# Patient Record
Sex: Male | Born: 1955 | Race: White | Hispanic: No | Marital: Married | Smoking: Never smoker
Health system: Southern US, Community
[De-identification: ages and names within clinical notes are randomized; demographics above are authoritative.]

## PROBLEM LIST (undated history)

## (undated) DIAGNOSIS — E7800 Pure hypercholesterolemia, unspecified: Secondary | ICD-10-CM

## (undated) DIAGNOSIS — N400 Enlarged prostate without lower urinary tract symptoms: Secondary | ICD-10-CM

---

## 2008-03-26 ENCOUNTER — Emergency Department (HOSPITAL_BASED_OUTPATIENT_CLINIC_OR_DEPARTMENT_OTHER): Admission: EM | Admit: 2008-03-26 | Discharge: 2008-03-26 | Payer: Self-pay | Admitting: Emergency Medicine

## 2012-06-13 ENCOUNTER — Encounter (HOSPITAL_BASED_OUTPATIENT_CLINIC_OR_DEPARTMENT_OTHER): Payer: Self-pay | Admitting: *Deleted

## 2012-06-13 ENCOUNTER — Emergency Department (HOSPITAL_BASED_OUTPATIENT_CLINIC_OR_DEPARTMENT_OTHER): Payer: BC Managed Care – PPO

## 2012-06-13 ENCOUNTER — Emergency Department (HOSPITAL_BASED_OUTPATIENT_CLINIC_OR_DEPARTMENT_OTHER)
Admission: EM | Admit: 2012-06-13 | Discharge: 2012-06-13 | Disposition: A | Discharge status: Home / Self Care | Payer: BC Managed Care – PPO | Attending: Emergency Medicine | Admitting: Emergency Medicine

## 2012-06-13 DIAGNOSIS — IMO0002 Reserved for concepts with insufficient information to code with codable children: Secondary | ICD-10-CM | POA: Insufficient documentation

## 2012-06-13 DIAGNOSIS — Y939 Activity, unspecified: Secondary | ICD-10-CM | POA: Insufficient documentation

## 2012-06-13 DIAGNOSIS — Z87448 Personal history of other diseases of urinary system: Secondary | ICD-10-CM | POA: Insufficient documentation

## 2012-06-13 DIAGNOSIS — Y9289 Other specified places as the place of occurrence of the external cause: Secondary | ICD-10-CM | POA: Insufficient documentation

## 2012-06-13 DIAGNOSIS — E7800 Pure hypercholesterolemia, unspecified: Secondary | ICD-10-CM | POA: Insufficient documentation

## 2012-06-13 DIAGNOSIS — M7020 Olecranon bursitis, unspecified elbow: Secondary | ICD-10-CM | POA: Insufficient documentation

## 2012-06-13 DIAGNOSIS — Z79899 Other long term (current) drug therapy: Secondary | ICD-10-CM | POA: Insufficient documentation

## 2012-06-13 DIAGNOSIS — M7021 Olecranon bursitis, right elbow: Secondary | ICD-10-CM

## 2012-06-13 HISTORY — DX: Pure hypercholesterolemia, unspecified: E78.00

## 2012-06-13 HISTORY — DX: Benign prostatic hyperplasia without lower urinary tract symptoms: N40.0

## 2012-06-13 MED ORDER — SULFAMETHOXAZOLE-TRIMETHOPRIM 800-160 MG PO TABS: 2.0000 | ORAL_TABLET | Freq: Two times a day (BID) | ORAL | Status: DC

## 2012-06-13 MED ORDER — HYDROCODONE-ACETAMINOPHEN 5-325 MG PO TABS: 2.0000 | ORAL_TABLET | ORAL | Status: DC | PRN

## 2012-06-13 NOTE — ED Provider Notes (Signed)
History  This chart was scribed for Gilda Crease, MD by Ardelia Mems, ED Scribe. This patient was seen in room MHH1/M[facility]1 and the patient's care was started at 5:45 PM.   CSN: [id number]  Arrival date & time 06/13/12  1657     Chief Complaint  Patient presents with  . Elbow Injury    The history is provided by the patient. No language interpreter was used.    HPI Comments: Jorge Arnold is a 57 y.o. male who presents to the Emergency Department complaining of an elbow injury that occurred 2 days ago at [facility]. Pt states that he hit his elbow on an amusement ride. Pt states that he has associated elbow pain, which was very mild at first and gradually has worsened over the past 2 days. Pt's elbow has associated redness and swelling, which he reports is gradually worsening. Pt has been taking 3 Aleve every 6 hours for pain with some relief. Pt states that he has a family dr. at [employer] and that he has never seen an orthopaedic dr. Pt denies head injury, neck pain,back pain, fever, nausea, diarrhea, vomiting or any other symptoms.   Past Medical History  Diagnosis Date  . Hypercholesteremia   . Prostate enlargement     History reviewed. No pertinent past surgical history.  History reviewed. No pertinent family history.  History  Substance Use Topics  . Smoking status: Never Smoker   . Smokeless tobacco: Not on file  . Alcohol Use: No      Review of Systems  Constitutional: Negative for fever and chills.  HENT: Negative for neck pain.   Gastrointestinal: Negative for nausea, vomiting and diarrhea.  Musculoskeletal: Negative for back pain.       Right elbow pain.    Allergies  Review of patient's allergies indicates no known allergies.  Home Medications   Current Outpatient Rx  Name  Route  Sig  Dispense  Refill  . simvastatin (ZOCOR) 5 MG tablet   Oral   Take 5 mg by mouth at bedtime.           BP 116/73  Pulse 62  Temp(Src) 98.2 F  (36.8 C) (Oral)  Resp 20  Ht 5\' 8"  (1.727 m)  Wt 179 lb (81.194 kg)  BMI 27.22 kg/m2  SpO2 97%  Physical Exam  Constitutional: He is oriented to person, place, and time. He appears well-developed and well-nourished. No distress.  HENT:  Head: Normocephalic and atraumatic.  Right Ear: Hearing normal.  Left Ear: Hearing normal.  Nose: Nose normal.  Mouth/Throat: Oropharynx is clear and moist and mucous membranes are normal.  Eyes: Conjunctivae and EOM are normal. Pupils are equal, round, and reactive to light.  Neck: Normal range of motion. Neck supple.  Cardiovascular: Regular rhythm, S1 normal and S2 normal.  Exam reveals no gallop and no friction rub.   No murmur heard. Pulmonary/Chest: Effort normal and breath sounds normal. No respiratory distress. He exhibits no tenderness.  Abdominal: Soft. Normal appearance and bowel sounds are normal. There is no hepatosplenomegaly. There is no tenderness. There is no rebound, no guarding, no tenderness at McBurney's point and negative Murphy's sign. No hernia.  Musculoskeletal: Normal range of motion.       Right elbow: He exhibits swelling. He exhibits normal range of motion. Tenderness found. Olecranon process tenderness noted.  Neurological: He is alert and oriented to person, place, and time. He has normal strength. No cranial nerve deficit or sensory deficit. Coordination normal.  GCS eye subscore is 4. GCS verbal subscore is 5. GCS motor subscore is 6.  Skin: Skin is warm, dry and intact. No rash noted. No cyanosis.     Psychiatric: He has a normal mood and affect. His speech is normal and behavior is normal. Thought content normal.    ED Course  Procedures (including critical care time)  DIAGNOSTIC STUDIES: Oxygen Saturation is 97% on RA, normal by my interpretation.    COORDINATION OF CARE: 5:47 PM- Pt advised of plan for treatment and pt agrees.     Labs Reviewed - No data to display Dg Elbow Complete Right  06/13/2012    *RADIOLOGY REPORT*  Clinical Data: Banged back of elbow on a roller coaster 2 days ago. Pain, swelling, redness.  RIGHT ELBOW - COMPLETE 3+ VIEW  Comparison: None.  Findings: There is no evidence for acute fracture or dislocation. No soft tissue foreign body or gas identified.  No evidence for joint effusion. There is soft tissue swelling along the posterior aspect of the elbow, adjacent to the olecranon.  IMPRESSION: 1.  Soft tissue swelling. 2. No evidence for acute osseous abnormality.   Original Report Authenticated By: Norva Pavlov, M.D.     Diagnosis: Olecranon Bursitis    MDM  PAtient presented with swollen, reddened area surrounding right elbow. Patient reports hitting the elbow 2 days prior. Xray negative. With erythema and warmth, must cover for infection.    I personally performed the services described in this documentation, which was scribed in my presence. The recorded information has been reviewed and is accurate.    Gilda Crease, MD 06/16/12 [phone]

## 2012-06-13 NOTE — ED Notes (Signed)
See MD assessment, nursing assessment not completed prior to discharge

## 2012-06-13 NOTE — ED Notes (Signed)
Pt states he injured his right elbow Friday on a ride at [facility]. Elbow is red and swollen.

## 2012-06-15 ENCOUNTER — Other Ambulatory Visit: Payer: Self-pay | Admitting: Family Medicine

## 2012-06-15 ENCOUNTER — Ambulatory Visit (INDEPENDENT_AMBULATORY_CARE_PROVIDER_SITE_OTHER): Payer: BC Managed Care – PPO | Admitting: Family Medicine

## 2012-06-15 ENCOUNTER — Encounter: Payer: Self-pay | Admitting: Family Medicine

## 2012-06-15 VITALS — BP 110/76 | HR 79 | Ht 68.0 in | Wt 179.0 lb

## 2012-06-15 DIAGNOSIS — M7020 Olecranon bursitis, unspecified elbow: Secondary | ICD-10-CM

## 2012-06-15 DIAGNOSIS — M7021 Olecranon bursitis, right elbow: Secondary | ICD-10-CM

## 2012-06-15 MED ORDER — DICLOXACILLIN SODIUM 500 MG PO CAPS: 500.0000 mg | ORAL_CAPSULE | Freq: Four times a day (QID) | ORAL | Status: DC

## 2012-06-15 MED ORDER — HYDROCODONE-ACETAMINOPHEN 5-325 MG PO TABS: 1.0000 | ORAL_TABLET | Freq: Four times a day (QID) | ORAL | Status: AC | PRN

## 2012-06-15 NOTE — Patient Instructions (Addendum)
Continue the bactrim until it is completed. Add dicloxacillin 4 times a day for 14 days. Finish these even if you're improving. If after 1-2 days you are still spiking fevers (above 100.4) or symptoms are worsening, return to the emergency department. In rare cases you may need IV antibiotics for this condition. Ice the area 3-4 times a day for 15 minutes at a time Aleve twice a day for pain, swelling, and inflammation. Norco as needed for severe pain. Compression wrap to help with swelling. Elbow pad for protection to prevent irritation and additional swelling. Follow up with me on Friday for reevaluation.

## 2012-06-16 ENCOUNTER — Observation Stay (HOSPITAL_BASED_OUTPATIENT_CLINIC_OR_DEPARTMENT_OTHER)
Admission: EM | Admit: 2012-06-16 | Discharge: 2012-06-17 | DRG: 248 | Disposition: A | Discharge status: Home / Self Care | Payer: BC Managed Care – PPO | Attending: Internal Medicine | Admitting: Internal Medicine

## 2012-06-16 ENCOUNTER — Encounter: Payer: Self-pay | Admitting: Family Medicine

## 2012-06-16 ENCOUNTER — Encounter (HOSPITAL_BASED_OUTPATIENT_CLINIC_OR_DEPARTMENT_OTHER): Payer: Self-pay | Admitting: *Deleted

## 2012-06-16 DIAGNOSIS — E785 Hyperlipidemia, unspecified: Secondary | ICD-10-CM | POA: Insufficient documentation

## 2012-06-16 DIAGNOSIS — M7021 Olecranon bursitis, right elbow: Secondary | ICD-10-CM

## 2012-06-16 DIAGNOSIS — M7020 Olecranon bursitis, unspecified elbow: Principal | ICD-10-CM | POA: Insufficient documentation

## 2012-06-16 DIAGNOSIS — M71121 Other infective bursitis, right elbow: Secondary | ICD-10-CM

## 2012-06-16 DIAGNOSIS — M71129 Other infective bursitis, unspecified elbow: Secondary | ICD-10-CM

## 2012-06-16 DIAGNOSIS — E7800 Pure hypercholesterolemia, unspecified: Secondary | ICD-10-CM | POA: Insufficient documentation

## 2012-06-16 LAB — CBC WITH DIFFERENTIAL/PLATELET
Basophils Absolute: 0 10*3/uL (ref 0.0–0.1)
Basophils Relative: 0 % (ref 0–1)
Eosinophils Absolute: 0.1 10*3/uL (ref 0.0–0.7)
Eosinophils Relative: 1 % (ref 0–5)
Lymphs Abs: 1.6 10*3/uL (ref 0.7–4.0)
MCH: 30.4 pg (ref 26.0–34.0)
Neutrophils Relative %: 70 % (ref 43–77)
Platelets: 169 10*3/uL (ref 150–400)
RBC: 4.71 MIL/uL (ref 4.22–5.81)
RDW: 12.1 % (ref 11.5–15.5)

## 2012-06-16 LAB — BASIC METABOLIC PANEL
Calcium: 9.6 mg/dL (ref 8.4–10.5)
GFR calc Af Amer: 69 mL/min — ABNORMAL LOW (ref >90)
GFR calc non Af Amer: 60 mL/min — ABNORMAL LOW (ref >90)
Glucose, Bld: 110 mg/dL — ABNORMAL HIGH (ref 70–99)
Potassium: 4 mEq/L (ref 3.5–5.1)
Sodium: 136 mEq/L (ref 135–145)

## 2012-06-16 LAB — BODY FLUID CELL COUNT WITH DIFFERENTIAL
Neutrophil Count, Fluid: 94 % — ABNORMAL HIGH (ref 0–25)
Total Nucleated Cell Count, Fluid: 2410 cu mm — ABNORMAL HIGH (ref 0–1000)

## 2012-06-16 MED ORDER — VANCOMYCIN HCL IN DEXTROSE 1-5 GM/200ML-% IV SOLN
1000.0000 mg | Freq: Once | INTRAVENOUS | Status: AC
  Administered 2012-06-16: 1000 mg via INTRAVENOUS
  Filled 2012-06-16: qty 200

## 2012-06-16 MED ORDER — SODIUM CHLORIDE 0.9 % IV SOLN
Freq: Once | INTRAVENOUS | Status: AC
  Administered 2012-06-16: 17:00:00 via INTRAVENOUS

## 2012-06-16 MED ORDER — HYDROCODONE-ACETAMINOPHEN 5-325 MG PO TABS
1.0000 | ORAL_TABLET | Freq: Once | ORAL | Status: AC
  Administered 2012-06-16: 1 via ORAL
  Filled 2012-06-16: qty 1

## 2012-06-16 MED ORDER — SODIUM CHLORIDE 0.9 % IV SOLN
INTRAVENOUS | Status: AC
  Administered 2012-06-16: 21:00:00 via INTRAVENOUS

## 2012-06-16 NOTE — Assessment & Plan Note (Signed)
concerning for septic bursitis.  Has full motion of elbow without pain.  Swelling is superficial within bursa and medial to this.  Aspirated this - 3 mL of cloudy yellow fluid obtained and sent for gram stain, culture, cell count.  Continue bactrim.  Add dicloxacillin.  Icing, compression.  F/u in 2-3 days.  If still spiking fever > 100.4 after 24 hours, worsening fatigue, dizziness advised to seek care in emergency department.  See instructions for further.  After informed written consent patient was lying supine on exam table.  Area overlying olecranon bursa prepped with alcohol swab, 3mL marcaine used for local anesthesia and bursa aspirated - 3 mL of cloudy yellow blood tinged fluid obtained and sent for analysis.

## 2012-06-16 NOTE — ED Notes (Signed)
Pt seen here Sunday for same, right elbow cellulitis, f/u up with sport med yesterday abx added , pt reports " feels worst today"

## 2012-06-16 NOTE — Progress Notes (Signed)
Patient ID: Jorge Arnold, male   DOB: Jan 02, 1956, 57 y.o.   MRN: [id number]  PCP: Gildardo Griffes, MD  Subjective:   HPI: Patient is a 57 y.o. male here for right elbow pain.  Patient reports on 6/6 while at [facility] he was on a rollercoaster and hit his right elbow on something to his side at his seat. Had a little pain but nothing severe.  Also had a very small cut on elbow. Then the next afternoon noticed worsening swelling, pain to the touch. This progressed to include redness, feeling feverish and fatigued the past 24 hours. Given norco and bactrim from ED which he is taking. No prior issues. Is right handed.  Past Medical History  Diagnosis Date  . Hypercholesteremia   . Prostate enlargement     Current Outpatient Prescriptions on File Prior to Visit  Medication Sig Dispense Refill  . simvastatin (ZOCOR) 5 MG tablet Take 5 mg by mouth at bedtime.      . sulfamethoxazole-trimethoprim (SEPTRA DS) 800-160 MG per tablet Take 2 tablets by mouth every 12 (twelve) hours.  40 tablet  0   No current facility-administered medications on file prior to visit.    History reviewed. No pertinent past surgical history.  No Known Allergies  History   Social History  . Marital Status: Married    Spouse Name: N/A    Number of Children: N/A  . Years of Education: N/A   Occupational History  . Not on file.   Social History Main Topics  . Smoking status: Never Smoker   . Smokeless tobacco: Not on file  . Alcohol Use: No  . Drug Use: No  . Sexually Active: Not on file   Other Topics Concern  . Not on file   Social History Narrative  . No narrative on file    Family History  Problem Relation Age of Onset  . Hypertension Mother   . Heart attack Father   . Diabetes Neg Hx   . Hyperlipidemia Neg Hx   . Sudden death Neg Hx     BP 110/76  Pulse 79  Ht 5\' 8"  (1.727 m)  Wt 179 lb (81.194 kg)  BMI 27.22 kg/m2  Review of Systems: See HPI above.    Objective:   Physical Exam:  Gen: NAD Temp 97.36F  R elbow: Boggy swelling over olecranon and medial to this.  Small cut with a scab over it over olecranon also.  No bruising.  Mild erythema and warmth around this as well. TTP over swollen area, olecranon bursa. FROM. Collateral ligaments intact. NVI distally.    Assessment & Plan:  1. Right olecranon bursitis - concerning for septic bursitis.  Has full motion of elbow without pain.  Swelling is superficial within bursa and medial to this.  Aspirated this - 3 mL of cloudy yellow fluid obtained and sent for gram stain, culture, cell count.  Continue bactrim.  Add dicloxacillin.  Icing, compression.  F/u in 2-3 days.  If still spiking fever > 100.4 after 24 hours, worsening fatigue, dizziness advised to seek care in emergency department.  See instructions for further.  After informed written consent patient was lying supine on exam table.  Area overlying olecranon bursa prepped with alcohol swab, 3mL marcaine used for local anesthesia and bursa aspirated - 3 mL of cloudy yellow blood tinged fluid obtained and sent for analysis.

## 2012-06-16 NOTE — ED Provider Notes (Signed)
Medical screening examination/treatment/procedure(s) were performed by non-physician practitioner and as supervising physician I was immediately available for consultation/collaboration.   Gwyneth Sprout, MD 06/16/12 2324

## 2012-06-16 NOTE — ED Notes (Signed)
Report to [facility] via phone-[facility] receiving nurse unavailable for report at this time-phone # given for call back

## 2012-06-16 NOTE — ED Notes (Signed)
PA at bedside.

## 2012-06-16 NOTE — ED Provider Notes (Signed)
History     CSN: [id number]  Arrival date & time 06/16/12  1448   First MD Initiated Contact with Patient 06/16/12 1604      Chief Complaint  Patient presents with  . Wound Check    (Consider location/radiation/quality/duration/timing/severity/associated sxs/prior treatment) Patient is a 57 y.o. male presenting with wound check. The history is provided by the patient. No language interpreter was used.  Wound Check The current episode started yesterday. Associated symptoms include chills, fatigue and myalgias. Pertinent negatives include no abdominal pain, chest pain, fever, headaches, nausea or vomiting. Associated symptoms comments: He returns to ED for recheck following drainage of an olecranon bursitis by Dr. Pearletha Forge yesterday. He feels a subjective fever but no temperature when measured at home. No N, V. Pain is slightly improved. Redness around the elbow is worse, but he states that he continues to feel poorly - fatigued, generally achy..    Past Medical History  Diagnosis Date  . Hypercholesteremia   . Prostate enlargement     History reviewed. No pertinent past surgical history.  Family History  Problem Relation Age of Onset  . Hypertension Mother   . Heart attack Father   . Diabetes Neg Hx   . Hyperlipidemia Neg Hx   . Sudden death Neg Hx     History  Substance Use Topics  . Smoking status: Never Smoker   . Smokeless tobacco: Not on file  . Alcohol Use: No      Review of Systems  Constitutional: Positive for chills and fatigue. Negative for fever.  Respiratory: Negative for shortness of breath.   Cardiovascular: Negative for chest pain.  Gastrointestinal: Negative for nausea, vomiting and abdominal pain.  Genitourinary: Negative for dysuria.  Musculoskeletal: Positive for myalgias.  Skin: Positive for color change.  Neurological: Negative for headaches.  Hematological: Does not bruise/bleed easily.  Psychiatric/Behavioral: Negative for confusion.     Allergies  Review of patient's allergies indicates no known allergies.  Home Medications   Current Outpatient Rx  Name  Route  Sig  Dispense  Refill  . dicloxacillin (DYNAPEN) 500 MG capsule   Oral   Take 1 capsule (500 mg total) by mouth 4 (four) times daily.   56 capsule   0   . HYDROcodone-acetaminophen (NORCO/VICODIN) 5-325 MG per tablet   Oral   Take 1 tablet by mouth every 6 (six) hours as needed for pain.   40 tablet   0   . simvastatin (ZOCOR) 5 MG tablet   Oral   Take 5 mg by mouth at bedtime.         . sulfamethoxazole-trimethoprim (SEPTRA DS) 800-160 MG per tablet   Oral   Take 2 tablets by mouth every 12 (twelve) hours.   40 tablet   0     BP 133/84  Pulse 66  Temp(Src) 98.5 F (36.9 C) (Oral)  Resp 18  SpO2 97%  Physical Exam  Constitutional: He is oriented to person, place, and time. He appears well-developed and well-nourished.  Neck: Normal range of motion.  Cardiovascular: Normal rate.   No murmur heard. Pulmonary/Chest: Effort normal. He has no wheezes. He has no rales.  Abdominal: There is no tenderness.  Musculoskeletal: Normal range of motion.  Right elbow moderately swollen with redness extending to volar aspect and distally to mid-distal forearm. No active drainage. Guarding elbow, mild tenderness.   Neurological: He is alert and oriented to person, place, and time.  Skin: Skin is warm and dry.  Psychiatric: He has  a normal mood and affect.    ED Course  Procedures (including critical care time)  Labs Reviewed - No data to display No results found. Results for orders placed during the hospital encounter of 06/16/12  CBC WITH DIFFERENTIAL      Result Value Range   WBC 8.2  4.0 - 10.5 K/uL   RBC 4.71  4.22 - 5.81 MIL/uL   Hemoglobin 14.3  13.0 - 17.0 g/dL   HCT 16.1  09.6 - 04.5 %   MCV 86.0  78.0 - 100.0 fL   MCH 30.4  26.0 - 34.0 pg   MCHC 35.3  30.0 - 36.0 g/dL   RDW 40.9  81.1 - 91.4 %   Platelets 169  150 - 400 K/uL    Neutrophils Relative % 70  43 - 77 %   Neutro Abs 5.8  1.7 - 7.7 K/uL   Lymphocytes Relative 19  12 - 46 %   Lymphs Abs 1.6  0.7 - 4.0 K/uL   Monocytes Relative 10  3 - 12 %   Monocytes Absolute 0.8  0.1 - 1.0 K/uL   Eosinophils Relative 1  0 - 5 %   Eosinophils Absolute 0.1  0.0 - 0.7 K/uL   Basophils Relative 0  0 - 1 %   Basophils Absolute 0.0  0.0 - 0.1 K/uL  BASIC METABOLIC PANEL      Result Value Range   Sodium 136  135 - 145 mEq/L   Potassium 4.0  3.5 - 5.1 mEq/L   Chloride 98  96 - 112 mEq/L   CO2 27  19 - 32 mEq/L   Glucose, Bld 110 (*) 70 - 99 mg/dL   BUN 17  6 - 23 mg/dL   Creatinine, Ser 7.82  0.50 - 1.35 mg/dL   Calcium 9.6  8.4 - 95.6 mg/dL   GFR calc non Af Amer 60 (*) >90 mL/min   GFR calc Af Amer 69 (*) >90 mL/min     No diagnosis found. 1. Septic bursitis    MDM  Discussed with Dr. Pearletha Forge who recommends admission for overnight observation and additional IV antibiotics (Vanc). He will follow in the office. Discussed with Dr. Toniann Fail who accepts patient for admission.         Arnoldo Hooker, PA-C 06/16/12 2026

## 2012-06-17 ENCOUNTER — Encounter (HOSPITAL_COMMUNITY): Payer: Self-pay | Admitting: Internal Medicine

## 2012-06-17 DIAGNOSIS — M71129 Other infective bursitis, unspecified elbow: Secondary | ICD-10-CM

## 2012-06-17 DIAGNOSIS — E785 Hyperlipidemia, unspecified: Secondary | ICD-10-CM

## 2012-06-17 DIAGNOSIS — A499 Bacterial infection, unspecified: Secondary | ICD-10-CM

## 2012-06-17 DIAGNOSIS — M7020 Olecranon bursitis, unspecified elbow: Secondary | ICD-10-CM

## 2012-06-17 MED ORDER — HYDROCODONE-ACETAMINOPHEN 5-325 MG PO TABS: 1.0000 | ORAL_TABLET | Freq: Four times a day (QID) | ORAL | Status: DC | PRN

## 2012-06-17 MED ORDER — VANCOMYCIN HCL IN DEXTROSE 1-5 GM/200ML-% IV SOLN
1000.0000 mg | Freq: Two times a day (BID) | INTRAVENOUS | Status: DC
  Administered 2012-06-17: 1000 mg via INTRAVENOUS
  Filled 2012-06-17 (×2): qty 200

## 2012-06-17 MED ORDER — DOCUSATE SODIUM 100 MG PO CAPS
100.0000 mg | ORAL_CAPSULE | Freq: Two times a day (BID) | ORAL | Status: DC
  Administered 2012-06-17 (×2): 100 mg via ORAL
  Filled 2012-06-17 (×2): qty 1

## 2012-06-17 MED ORDER — SIMVASTATIN 5 MG PO TABS
5.0000 mg | ORAL_TABLET | Freq: Every day | ORAL | Status: DC
  Filled 2012-06-17: qty 1

## 2012-06-17 MED ORDER — ONDANSETRON HCL 4 MG/2ML IJ SOLN: 4.0000 mg | Freq: Four times a day (QID) | INTRAMUSCULAR | Status: DC | PRN

## 2012-06-17 MED ORDER — SODIUM CHLORIDE 0.9 % IJ SOLN: 3.0000 mL | Freq: Two times a day (BID) | INTRAMUSCULAR | Status: DC

## 2012-06-17 MED ORDER — ONDANSETRON HCL 4 MG PO TABS: 4.0000 mg | ORAL_TABLET | Freq: Four times a day (QID) | ORAL | Status: DC | PRN

## 2012-06-17 MED ORDER — ZOLPIDEM TARTRATE 5 MG PO TABS: 5.0000 mg | ORAL_TABLET | Freq: Every evening | ORAL | Status: DC | PRN

## 2012-06-17 MED ORDER — SODIUM CHLORIDE 0.9 % IV SOLN: 250.0000 mL | INTRAVENOUS | Status: DC | PRN

## 2012-06-17 MED ORDER — HYDROMORPHONE HCL PF 1 MG/ML IJ SOLN: 1.0000 mg | INTRAMUSCULAR | Status: DC | PRN

## 2012-06-17 MED ORDER — SODIUM CHLORIDE 0.9 % IJ SOLN: 3.0000 mL | INTRAMUSCULAR | Status: DC | PRN

## 2012-06-17 NOTE — Progress Notes (Signed)
ANTIBIOTIC CONSULT NOTE - INITIAL  Pharmacy Consult for vancomycin Indication: septic bursitis  No Known Allergies  Patient Measurements: Height: 5\' 8"  (172.7 cm) Weight: 179 lb (81.194 kg) IBW/kg (Calculated) : 68.4  Vital Signs: Temp: 98.3 F (36.8 C) (06/11 2221) Temp src: Axillary (06/11 2022) BP: 134/79 mmHg (06/11 2221) Pulse Rate: 66 (06/11 2221)  Labs:  Recent Labs  06/16/12 1830  WBC 8.2  HGB 14.3  PLT 169  CREATININE 1.30   Estimated Creatinine Clearance: 61.4 ml/min (by C-G formula based on Cr of 1.3).   Microbiology: Recent Results (from the past 720 hour(s))  BODY FLUID CULTURE     Status: None   Collection Time    06/15/12 12:00 AM      Result Value Range Status   GRAM STAIN Few   Preliminary   GRAM STAIN WBC present-both PMN and Mononuclear   Preliminary   GRAM STAIN No Organisms Seen   Preliminary    Medical History: Past Medical History  Diagnosis Date  . Hypercholesteremia   . Prostate enlargement     Medications:  Prescriptions prior to admission  Medication Sig Dispense Refill  . dicloxacillin (DYNAPEN) 500 MG capsule Take 1 capsule (500 mg total) by mouth 4 (four) times daily.  56 capsule  0  . HYDROcodone-acetaminophen (NORCO/VICODIN) 5-325 MG per tablet Take 1 tablet by mouth every 6 (six) hours as needed for pain.  40 tablet  0  . simvastatin (ZOCOR) 5 MG tablet Take 5 mg by mouth at bedtime.      . sulfamethoxazole-trimethoprim (SEPTRA DS) 800-160 MG per tablet Take 2 tablets by mouth every 12 (twelve) hours.  40 tablet  0   Scheduled:  . sodium chloride   Intravenous STAT  . docusate sodium  100 mg Oral BID  . simvastatin  5 mg Oral QHS  . sodium chloride  3 mL Intravenous Q12H    Assessment: 57yo male seen y sports medicine today for progressive pain and swelling to injury sustained 6/6, MD aspirated 3ml of cloudy yellow fluid which showed WBC and neutrophil, NGTD, concern for septic bursitis, to begin IV ABX.  Goal of  Therapy:  Vancomycin trough level 15-20 mcg/ml  Plan:  Rec'd vanc 1g earlier today; will continue with 1000mg  IV Q12H and monitor CBC, Cx, levels prn.  Vernard Gambles, PharmD, BCPS  06/17/2012,1:59 AM

## 2012-06-17 NOTE — H&P (Signed)
[facility] History and Physical  Abdirahman Chittum OZH:[id number] DOB: 19-Jun-1955    PCP:   Gildardo Griffes, MD   Chief Complaint: increase swelling and pain of the right elbow.  HPI: Jorge Arnold is an 57 y.o. male previously healthy with hx of hypercholesterolemia on zocor, presents to sport medicine physician with swelling elbow yesterday.  He had a tap which showed 2400 WBCs, given doxycycline on top of Bactrim he already taken.  He had a superficial cut on his elbow before on a lawnmower blade.  Over the next 24 hours, he has increase pain, redness and swelling.  He went to [facility], given a dose of vancomycin, and EDP consulted orthopedics who recommended IV antibiotics and [facility] admission.    Rewiew of Systems:  Constitutional: Negative for malaise, fever and chills. No significant weight loss or weight gain Eyes: Negative for eye pain, redness and discharge, diplopia, visual changes, or flashes of light. ENMT: Negative for ear pain, hoarseness, nasal congestion, sinus pressure and sore throat. No headaches; tinnitus, drooling, or problem swallowing. Cardiovascular: Negative for chest pain, palpitations, diaphoresis, dyspnea and peripheral edema. ; No orthopnea, PND Respiratory: Negative for cough, hemoptysis, wheezing and stridor. No pleuritic chestpain. Gastrointestinal: Negative for nausea, vomiting, diarrhea, constipation, abdominal pain, melena, blood in stool, hematemesis, jaundice and rectal bleeding.    Genitourinary: Negative for frequency, dysuria, incontinence,flank pain and hematuria; Musculoskeletal: Negative for back pain and neck pain.  Skin: . Negative for pruritus, rash, abrasions, bruising and skin lesion.; ulcerations Neuro: Negative for headache, lightheadedness and neck stiffness. Negative for weakness, altered level of consciousness , altered mental status, extremity weakness, burning feet, involuntary movement, seizure and syncope.  Psych: negative for anxiety,  depression, insomnia, tearfulness, panic attacks, hallucinations, paranoia, suicidal or homicidal ideation   Past Medical History  Diagnosis Date  . Hypercholesteremia   . Prostate enlargement     History reviewed. No pertinent past surgical history.  Medications:  HOME MEDS: Prior to Admission medications   Medication Sig Start Date End Date Taking? Authorizing Provider  dicloxacillin (DYNAPEN) 500 MG capsule Take 1 capsule (500 mg total) by mouth 4 (four) times daily. 06/15/12   Lenda Kelp, MD  HYDROcodone-acetaminophen (NORCO/VICODIN) 5-325 MG per tablet Take 1 tablet by mouth every 6 (six) hours as needed for pain. 06/15/12   Lenda Kelp, MD  simvastatin (ZOCOR) 5 MG tablet Take 5 mg by mouth at bedtime.    Historical Provider, MD  sulfamethoxazole-trimethoprim (SEPTRA DS) 800-160 MG per tablet Take 2 tablets by mouth every 12 (twelve) hours. 06/13/12   Gilda Crease, MD     Allergies:  No Known Allergies  Social History:   reports that he has never smoked. He does not have any smokeless tobacco history on file. He reports that he does not drink alcohol or use illicit drugs.  Family History: Family History  Problem Relation Age of Onset  . Hypertension Mother   . Heart attack Father   . Diabetes Neg Hx   . Hyperlipidemia Neg Hx   . Sudden death Neg Hx      Physical Exam: Filed Vitals:   06/16/12 1456 06/16/12 2022 06/16/12 2221 06/17/12 0100  BP: 133/84 128/79 134/79   Pulse: 66 72 66   Temp: 98.5 F (36.9 C) 98.2 F (36.8 C) 98.3 F (36.8 C)   TempSrc: Oral Axillary    Resp: 18 20 18    Height:    5\' 8"  (1.727 m)  Weight:    81.194 kg (179  lb)  SpO2: 97% 98% 98%    Blood pressure 134/79, pulse 66, temperature 98.3 F (36.8 C), temperature source Axillary, resp. rate 18, height 5\' 8"  (1.727 m), weight 81.194 kg (179 lb), SpO2 98.00%.  GEN:  Pleasant  patient lying in the stretcher in no acute distress; cooperative with exam. PSYCH:  alert  and oriented x4; does not appear anxious or depressed; affect is appropriate. HEENT: Mucous membranes pink and anicteric; PERRLA; EOM intact; no cervical lymphadenopathy nor thyromegaly or carotid bruit; no JVD; There were no stridor. Neck is very supple. Breasts:: Not examined CHEST WALL: No tenderness CHEST: Normal respiration, clear to auscultation bilaterally.  HEART: Regular rate and rhythm.  There are no murmur, rub, or gallops.   BACK: No kyphosis or scoliosis; no CVA tenderness ABDOMEN: soft and non-tender; no masses, no organomegaly, normal abdominal bowel sounds; no pannus; no intertriginous candida. There is no rebound and no distention. Rectal Exam: Not done EXTREMITIES: No bone or joint deformity; age-appropriate arthropathy of the hands and knees; There is swelling, tender and redness of the right elbow with proximal extension.  No axillary lymphadenopathy.  Elbow with FROM. Genitalia: not examined PULSES: 2+ and symmetric SKIN: Normal hydration no rash or ulceration CNS: Cranial nerves 2-12 grossly intact no focal lateralizing neurologic deficit.  Speech is fluent; uvula elevated with phonation, facial symmetry and tongue midline. DTR are normal bilaterally, cerebella exam is intact, barbinski is negative and strengths are equaled bilaterally.  No sensory loss.   Labs on Admission:  Basic Metabolic Panel:  Recent Labs Lab 06/16/12 1830  NA 136  K 4.0  CL 98  CO2 27  GLUCOSE 110*  BUN 17  CREATININE 1.30  CALCIUM 9.6   Liver Function Tests: No results found for this basename: AST, ALT, ALKPHOS, BILITOT, PROT, ALBUMIN,  in the last 168 hours No results found for this basename: LIPASE, AMYLASE,  in the last 168 hours No results found for this basename: AMMONIA,  in the last 168 hours CBC:  Recent Labs Lab 06/16/12 1830  WBC 8.2  NEUTROABS 5.8  HGB 14.3  HCT 40.5  MCV 86.0  PLT 169   Cardiac Enzymes: No results found for this basename: CKTOTAL, CKMB,  CKMBINDEX, TROPONINI,  in the last 168 hours  CBG: No results found for this basename: GLUCAP,  in the last 168 hours   Radiological Exams on Admission: No results found.  Assessment/Plan  Septic Olecranon Bursitis:    WBC  from the tap is 2400 , GS didn't show any organism, culture is pending.  I think VANCOMYCIN should be adequate coverage.  Ortho will consult in the am.  I have continued his Zocor.  He is stable, full code, and will be admitted to [facility] service.  Other plans as per orders.  Code Status:FULL CODE.   Houston Siren, MD. [facility] Pager [phone] 7pm to 7am.  06/17/2012, 2:06 AM

## 2012-06-17 NOTE — Discharge Summary (Signed)
Physician Discharge Summary  Hillel Card ZOX:[id number] DOB: 05/18/1955 DOA: 06/16/2012  PCP: Gildardo Griffes, MD  Admit date: 06/16/2012 Discharge date: 06/17/2012  Time spent: 30 minutes  Recommendations for Outpatient Follow-up:  1. Continue oral antibiotics as previously prescribed 2. Follow up final fluid culture results  Discharge Diagnoses:  Active Problems:   Septic bursitis of elbow   Other and unspecified hyperlipidemia   Discharge Condition: Improved  Diet recommendation: Regular  Filed Weights   06/17/12 0100  Weight: 81.194 kg (179 lb)    History of present illness:  Jorge Arnold is an 57 y.o. male previously healthy with hx of hypercholesterolemia on zocor, presents to sport medicine physician with swelling elbow yesterday. He had a tap which showed 2400 WBCs, given doxycycline on top of Bactrim he already taken. He had a superficial cut on his elbow before on a lawnmower blade. Over the next 24 hours, he has increase pain, redness and swelling. He went to [facility], given a dose of vancomycin, and EDP consulted orthopedics who recommended IV antibiotics and [facility] admission.   Hospital Course:  The patient was continued with IV vancomycin overnight. Culture results demonstrated no organisms. The patient remained afebrile with no leukocytosis. The following morning, the pt noted improvement in swelling and redness of the R elbow and wishes to go home. Pt is hemodynamically stable with normal vital signs at the time of discharge. He has been instructed to continue his home oral antibiotics and to follow up very shortly.    Discharge Exam: Filed Vitals:   06/16/12 2022 06/16/12 2221 06/17/12 0100 06/17/12 0530  BP: 128/79 134/79  110/67  Pulse: 72 66  62  Temp: 98.2 F (36.8 C) 98.3 F (36.8 C)  98.5 F (36.9 C)  TempSrc: Axillary   Oral  Resp: 20 18  18   Height:   5\' 8"  (1.727 m)   Weight:   81.194 kg (179 lb)   SpO2: 98% 98%  95%    General: Awake, in  nad  Cardiovascular: regular, s1, s2 Respiratory: normal resp effort, no crackles or wheezing  Discharge Instructions   Future Appointments Provider Department Dept Phone   06/18/2012 11:30 AM Lenda Kelp, MD [facility] at [facility] [phone]       Medication List    TAKE these medications       dicloxacillin 500 MG capsule  Commonly known as:  DYNAPEN  Take 1 capsule (500 mg total) by mouth 4 (four) times daily.     HYDROcodone-acetaminophen 5-325 MG per tablet  Commonly known as:  NORCO/VICODIN  Take 1 tablet by mouth every 6 (six) hours as needed for pain.     simvastatin 5 MG tablet  Commonly known as:  ZOCOR  Take 5 mg by mouth at bedtime.     sulfamethoxazole-trimethoprim 800-160 MG per tablet  Commonly known as:  SEPTRA DS  Take 2 tablets by mouth every 12 (twelve) hours.       No Known Allergies     Follow-up Information   Schedule an appointment as soon as possible for a visit with Gildardo Griffes, MD.   Contact information:   [address] [place] [address]       Follow up with Norton Blizzard, MD In 2 days. (as scheduled)    Contact information:   [address] [place] [address] [phone]        The results of significant diagnostics from this hospitalization (including imaging,  microbiology, ancillary and laboratory) are listed below for reference.    Significant Diagnostic Studies: Dg Elbow Complete Right  06/13/2012   *RADIOLOGY REPORT*  Clinical Data: Banged back of elbow on a roller coaster 2 days ago. Pain, swelling, redness.  RIGHT ELBOW - COMPLETE 3+ VIEW  Comparison: None.  Findings: There is no evidence for acute fracture or dislocation. No soft tissue foreign body or gas identified.  No evidence for joint effusion. There is soft tissue swelling along the posterior aspect of the elbow, adjacent to the olecranon.  IMPRESSION: 1.  Soft tissue swelling. 2. No evidence for acute  osseous abnormality.   Original Report Authenticated By: Norva Pavlov, M.D.    Microbiology: Recent Results (from the past 240 hour(s))  BODY FLUID CULTURE     Status: None   Collection Time    06/15/12 12:00 AM      Result Value Range Status   GRAM STAIN Few   Preliminary   GRAM STAIN WBC present-both PMN and Mononuclear   Preliminary   GRAM STAIN No Organisms Seen   Preliminary     Labs: Basic Metabolic Panel:  Recent Labs Lab 06/16/12 1830  NA 136  K 4.0  CL 98  CO2 27  GLUCOSE 110*  BUN 17  CREATININE 1.30  CALCIUM 9.6   Liver Function Tests: No results found for this basename: AST, ALT, ALKPHOS, BILITOT, PROT, ALBUMIN,  in the last 168 hours No results found for this basename: LIPASE, AMYLASE,  in the last 168 hours No results found for this basename: AMMONIA,  in the last 168 hours CBC:  Recent Labs Lab 06/16/12 1830  WBC 8.2  NEUTROABS 5.8  HGB 14.3  HCT 40.5  MCV 86.0  PLT 169   Cardiac Enzymes: No results found for this basename: CKTOTAL, CKMB, CKMBINDEX, TROPONINI,  in the last 168 hours BNP: BNP (last 3 results) No results found for this basename: PROBNP,  in the last 8760 hours CBG: No results found for this basename: GLUCAP,  in the last 168 hours     Signed:  Susa Bones K  [facility] 06/17/2012, 8:52 AM

## 2012-06-18 ENCOUNTER — Ambulatory Visit (INDEPENDENT_AMBULATORY_CARE_PROVIDER_SITE_OTHER): Payer: BC Managed Care – PPO | Admitting: Family Medicine

## 2012-06-18 ENCOUNTER — Encounter: Payer: Self-pay | Admitting: Family Medicine

## 2012-06-18 VITALS — BP 123/89 | HR 69 | Temp 98.2°F | Ht 68.0 in | Wt 175.4 lb

## 2012-06-18 DIAGNOSIS — L0390 Cellulitis, unspecified: Secondary | ICD-10-CM

## 2012-06-18 DIAGNOSIS — L0291 Cutaneous abscess, unspecified: Secondary | ICD-10-CM

## 2012-06-18 DIAGNOSIS — A499 Bacterial infection, unspecified: Secondary | ICD-10-CM

## 2012-06-18 DIAGNOSIS — M7021 Olecranon bursitis, right elbow: Secondary | ICD-10-CM

## 2012-06-18 DIAGNOSIS — M7020 Olecranon bursitis, unspecified elbow: Secondary | ICD-10-CM

## 2012-06-18 DIAGNOSIS — M71121 Other infective bursitis, right elbow: Secondary | ICD-10-CM

## 2012-06-20 LAB — BODY FLUID CULTURE: Gram Stain: NONE SEEN

## 2012-06-21 ENCOUNTER — Ambulatory Visit (INDEPENDENT_AMBULATORY_CARE_PROVIDER_SITE_OTHER): Payer: BC Managed Care – PPO | Admitting: Internal Medicine

## 2012-06-21 ENCOUNTER — Encounter: Payer: Self-pay | Admitting: Internal Medicine

## 2012-06-21 ENCOUNTER — Encounter: Payer: Self-pay | Admitting: Family Medicine

## 2012-06-21 VITALS — BP 113/76 | HR 67 | Temp 98.1°F | Ht 68.0 in | Wt 179.8 lb

## 2012-06-21 DIAGNOSIS — L0390 Cellulitis, unspecified: Secondary | ICD-10-CM | POA: Insufficient documentation

## 2012-06-21 DIAGNOSIS — M7020 Olecranon bursitis, unspecified elbow: Secondary | ICD-10-CM

## 2012-06-21 DIAGNOSIS — M7021 Olecranon bursitis, right elbow: Secondary | ICD-10-CM

## 2012-06-21 MED ORDER — DICLOXACILLIN SODIUM 500 MG PO CAPS: 500.0000 mg | ORAL_CAPSULE | Freq: Four times a day (QID) | ORAL | Status: AC

## 2012-06-21 MED ORDER — DICLOXACILLIN SODIUM 500 MG PO CAPS: 500.0000 mg | ORAL_CAPSULE | Freq: Four times a day (QID) | ORAL | Status: DC

## 2012-06-21 NOTE — Progress Notes (Signed)
Patient ID: Jorge Arnold, male   DOB: Apr 06, 1955, 57 y.o.   MRN: [id number]         [facility] for Infectious Disease  Reason for Consult: Methicillin sensitive staph aureus right olecranon bursitis Referring Physician: Dr. Norton Blizzard  Patient Active Problem List   Diagnosis Date Noted  . Cellulitis 06/21/2012  . Septic bursitis of elbow 06/17/2012  . Other and unspecified hyperlipidemia 06/17/2012  . Olecranon bursitis of right elbow 06/16/2012    Patient's Medications  New Prescriptions   No medications on file  Previous Medications   HYDROCODONE-ACETAMINOPHEN (NORCO/VICODIN) 5-325 MG PER TABLET    Take 1 tablet by mouth every 6 (six) hours as needed for pain.   SIMVASTATIN (ZOCOR) 5 MG TABLET    Take 5 mg by mouth at bedtime.  Modified Medications   Modified Medication Previous Medication   DICLOXACILLIN (DYNAPEN) 500 MG CAPSULE dicloxacillin (DYNAPEN) 500 MG capsule      Take 1 capsule (500 mg total) by mouth 4 (four) times daily.    Take 1 capsule (500 mg total) by mouth 4 (four) times daily.  Discontinued Medications   SULFAMETHOXAZOLE-TRIMETHOPRIM (SEPTRA DS) 800-160 MG PER TABLET    Take 2 tablets by mouth every 12 (twelve) hours.    Recommendations: 1. Continue dicloxacillin 2. Discontinue trimethoprim sulfamethoxazole 3. Referred to Dr. Elbert Ewings in [place], [facility] for consideration of incision and drainage 4. Follow up here in one week   Assessment: He has MSSA olecranon bursitis. Although he has improved somewhat with antibiotic therapy, improvement has plateaued recently and I think that he would do much better he has he had incision and drainage. He would like to be seen closer to home in [place], [place]. I have arranged orthopedic evaluation by Dr. Earma Reading tomorrow.  HPI: Jorge Arnold is a 57 y.o. male who has been in good health until he suffered a small scratch on his right elbow about a week ago while he was  cleaning the back on his riding lawnmower. He didn't have any problems until he was at [facility] last week. While on a ride he bumped his right elbow and then shortly thereafter he began to have pain, redness and swelling over the tip of the elbow. He was seen at [facility] emergency department on June 8. X-ray showed soft tissue swelling only. He was started on empiric oral trimethoprim sulfamethoxazole and referred to Dr. Pearletha Forge. The olecranon bursa was aspirated revealing 2400 white blood cells. Cultures grew methicillin sensitive staph aureus. Dicloxacillin was added to the trimethoprim sulfamethoxazole. He was not improving rapidly so he was admitted to the hospital overnight from June 11-12. He received IV vancomycin while hospitalized and then was switched back to oral trimethoprim sulfamethoxazole and dicloxacillin on discharge. Overall he is better with some slight decrease in swelling and pain. He is having a little bit of nausea has felt hot but is not aware of having any documented fevers.  Review of Systems: Constitutional: positive for malaise, negative for chills, fevers and sweats Eyes: negative Ears, nose, mouth, throat, and face: negative Respiratory: negative Cardiovascular: negative Gastrointestinal: positive for nausea, negative for abdominal pain, diarrhea and vomiting Genitourinary:negative Musculoskeletal:positive for pain and swelling of right elbow, negative for muscle weakness and stiff joints    Past Medical History  Diagnosis Date  . Hypercholesteremia   . Prostate enlargement     History  Substance Use Topics  . Smoking status: Never Smoker   .  Smokeless tobacco: Not on file  . Alcohol Use: No    Family History  Problem Relation Age of Onset  . Hypertension Mother   . Heart attack Father   . Diabetes Neg Hx   . Hyperlipidemia Neg Hx   . Sudden death Neg Hx    No Known Allergies  OBJECTIVE: Blood pressure 113/76, pulse 67, temperature  98.1 F (36.7 C), temperature source Oral, height 5\' 8"  (1.727 m), weight 179 lb 12 oz (81.534 kg). General: He is comfortable and in no distress Skin: No generalized rash, splinter or conjunctival hemorrhages Oral: No oropharyngeal lesions Lymph nodes: No palpable adenopathy Lungs: Clear Cor: Regular S1 and S2 with no murmurs Abdomen: Soft and nontender Joints and extremities: His right olecranon bursa is swollen and fluctuant. He has moderate redness with a little bit of tenderness with palpation. There is some peeling skin. He has good range of motion with no unusual pain.  Microbiology: Recent Results (from the past 240 hour(s))  BODY FLUID CULTURE     Status: None   Collection Time    06/15/12 12:00 AM      Result Value Range Status   Culture Rare STAPHYLOCOCCUS AUREUS   Final   GRAM STAIN Few   Final   GRAM STAIN WBC present-both PMN and Mononuclear   Final   GRAM STAIN No Organisms Seen   Final   Organism ID, Bacteria STAPHYLOCOCCUS AUREUS   Final   Comment: Rifampin and Gentamicin should not be used as     single drugs for treatment of Staph infections.    RIGHT ELBOW - COMPLETE 3+ VIEW 06/13/2012  Comparison: None.  Findings: There is no evidence for acute fracture or dislocation.  No soft tissue foreign body or gas identified. No evidence for  joint effusion. There is soft tissue swelling along the posterior  aspect of the elbow, adjacent to the olecranon.   IMPRESSION:  1. Soft tissue swelling.  2. No evidence for acute osseous abnormality.   Original Report Authenticated By: Norva Pavlov, M.D.         Jorge Asters, MD [facility] for Infectious Disease [facility] [phone] pager   [phone] cell 06/21/2012, 3:51 PM

## 2012-06-21 NOTE — Patient Instructions (Addendum)
Referral placed to ID - will see them on Monday for follow-up, further recommendations.

## 2012-06-21 NOTE — Assessment & Plan Note (Signed)
Unfortunately had antibiotics started prior to aspiration and culture so to date culture has not grown anything.  Given 2 doses of vancomycin by IV, has been compliant with dicloxacillin and bactrim as well.  Given extension of his cellulitis, persistent redness and flu-like symptoms I advised we move forward with ID referral - has appointment with them on Monday (earliest could get in).  I expect he will start improving over the next 2 days - he remains afebrile.  Continue current bactrim and dicloxacillin in meantime.  F/u with [place] prn unless ID releases him.

## 2012-06-21 NOTE — Progress Notes (Signed)
Patient ID: Jorge Arnold, male   DOB: September 20, 1955, 57 y.o.   MRN: [id number]  PCP: Gildardo Griffes, MD  Subjective:   HPI: Patient is a 57 y.o. male here for f/u right elbow pain.  6/10: Patient reports on 6/6 while at [facility] he was on a rollercoaster and hit his right elbow on something to his side at his seat. Had a little pain but nothing severe.  Also had a very small cut on elbow. Then the next afternoon noticed worsening swelling, pain to the touch. This progressed to include redness, feeling feverish and fatigued the past 24 hours. Given norco and bactrim from ED which he is taking. No prior issues. Is right handed.  6/13: Patient was hospitalized overnight as he did not feel better on oral antibiotics. Went to ED - physician there called me noting his redness extended through forearm which was not the case when he saw me in the office. He was given two doses of IV vancomycin and discharged on current antibiotics of bactrim and dicloxacillin. Still feels fatigued, febrile.  Does not feel much better. Redness appears unchanged to him though difficult to see.  Past Medical History  Diagnosis Date  . Hypercholesteremia   . Prostate enlargement     Current Outpatient Prescriptions on File Prior to Visit  Medication Sig Dispense Refill  . dicloxacillin (DYNAPEN) 500 MG capsule Take 1 capsule (500 mg total) by mouth 4 (four) times daily.  56 capsule  0  . HYDROcodone-acetaminophen (NORCO/VICODIN) 5-325 MG per tablet Take 1 tablet by mouth every 6 (six) hours as needed for pain.  40 tablet  0  . simvastatin (ZOCOR) 5 MG tablet Take 5 mg by mouth at bedtime.      . sulfamethoxazole-trimethoprim (SEPTRA DS) 800-160 MG per tablet Take 2 tablets by mouth every 12 (twelve) hours.  40 tablet  0   No current facility-administered medications on file prior to visit.    History reviewed. No pertinent past surgical history.  No Known Allergies  History   Social History  .  Marital Status: Married    Spouse Name: N/A    Number of Children: N/A  . Years of Education: N/A   Occupational History  . Not on file.   Social History Main Topics  . Smoking status: Never Smoker   . Smokeless tobacco: Not on file  . Alcohol Use: No  . Drug Use: No  . Sexually Active: Not on file   Other Topics Concern  . Not on file   Social History Narrative  . No narrative on file    Family History  Problem Relation Age of Onset  . Hypertension Mother   . Heart attack Father   . Diabetes Neg Hx   . Hyperlipidemia Neg Hx   . Sudden death Neg Hx     BP 123/89  Pulse 69  Temp(Src) 98.2 F (36.8 C) (Oral)  Ht 5\' 8"  (1.727 m)  Wt 175 lb 6 oz (79.55 kg)  BMI 26.67 kg/m2  Review of Systems: See HPI above.    Objective:  Physical Exam:  Gen: NAD Temp 97.37F  R elbow: Boggy swelling over olecranon and medial to this though less so than last OV.  Small cut with a scab over it over olecranon also.  No bruising.  Erythema now extends from triceps tendon area distally along volar forearm about 2/3rds down.  Marked with a pen - advised patient to try to keep pen marking in place.  TTP over olecranon bursa area. FROM elbow without pain. Collateral ligaments intact. NVI distally.    Assessment & Plan:  1. Cellulitis and right olecranon septic bursitis.  Unfortunately had antibiotics started prior to aspiration and culture so to date culture has not grown anything.  Given 2 doses of vancomycin by IV, has been compliant with dicloxacillin and bactrim as well.  Given extension of his cellulitis, persistent redness and flu-like symptoms I advised we move forward with ID referral - has appointment with them on Monday (earliest could get in).  I expect he will start improving over the next 2 days - he remains afebrile.  Continue current bactrim and dicloxacillin in meantime.  F/u with [place] prn unless ID releases him.

## 2012-06-21 NOTE — Assessment & Plan Note (Signed)
Unfortunately had antibiotics started prior to aspiration and culture so to date culture has not grown anything.  Given 2 doses of vancomycin by IV, has been compliant with dicloxacillin and bactrim as well.  Given extension of his cellulitis, persistent redness and flu-like symptoms I advised we move forward with ID referral - has appointment with them on Monday (earliest could get in).  I expect he will start improving over the next 2 days - he remains afebrile.  Continue current bactrim and dicloxacillin in meantime.  F/u with us prn unless ID releases him. 

## 2012-06-28 ENCOUNTER — Ambulatory Visit: Payer: BC Managed Care – PPO | Admitting: Internal Medicine

## 2015-02-21 IMAGING — CR DG ELBOW COMPLETE 3+V*R*
4 series · 4 of 4 positions shown · non-contrast
Comparison: None.

CLINICAL DATA: Banged back of elbow on a roller coaster 2 days ago.
Pain, swelling, redness.

RIGHT ELBOW - COMPLETE 3+ VIEW

[x elbow joint ap right]
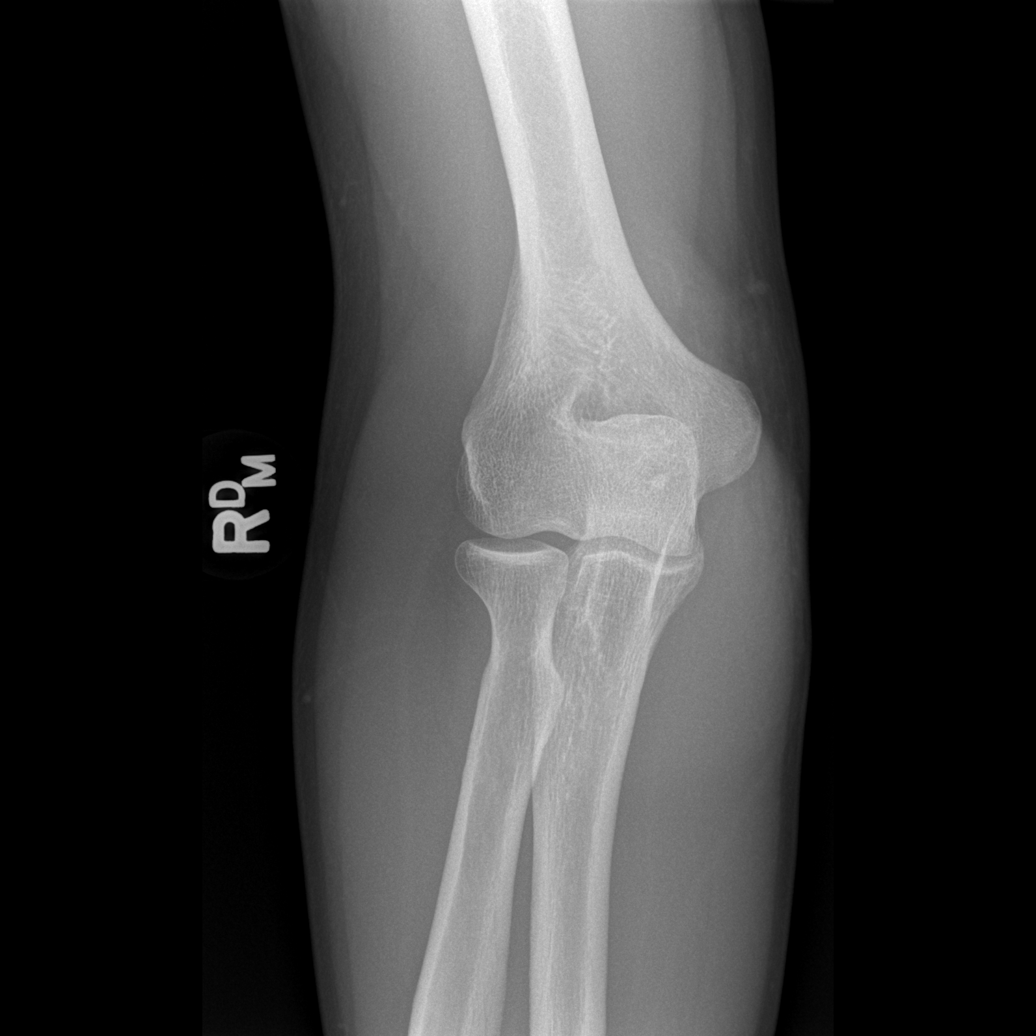

[x elbow joint obl. right (1 of 2)]
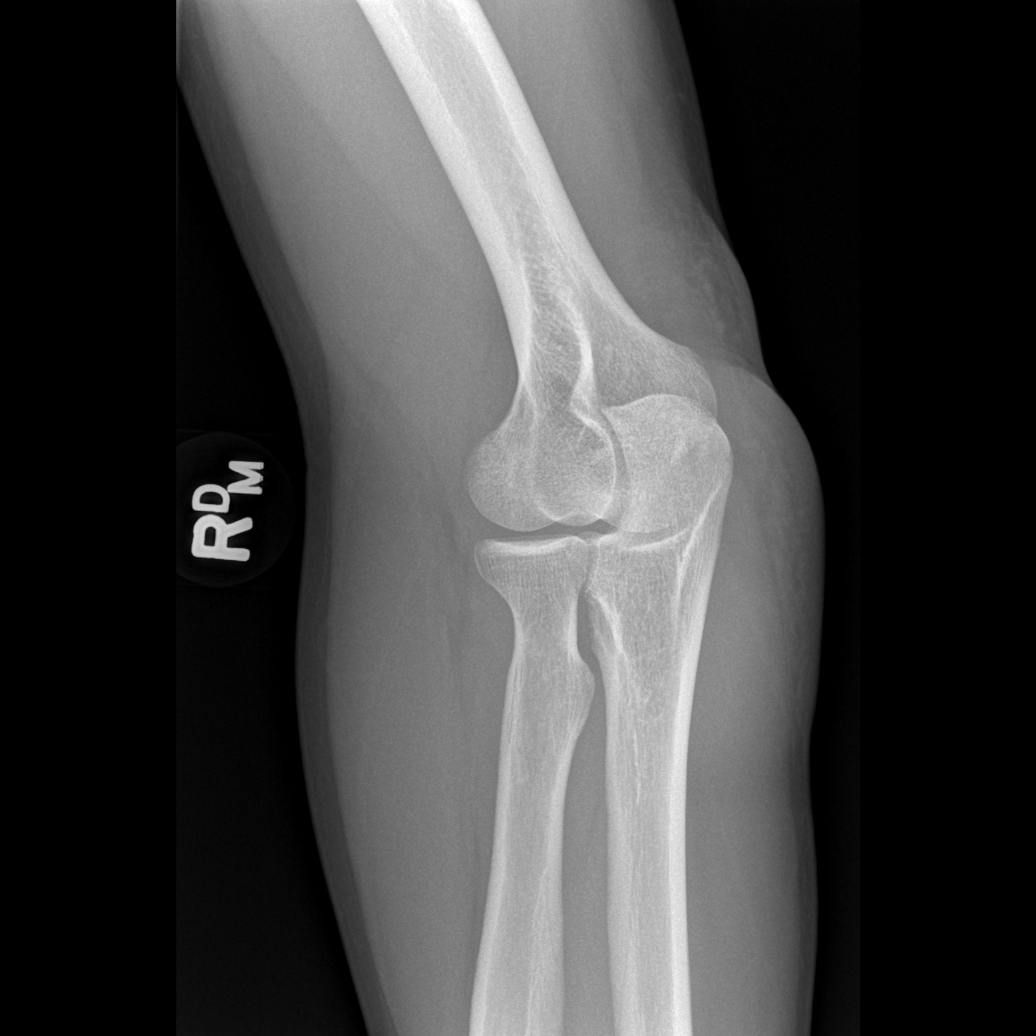

[x elbow joint obl. right (2 of 2)]
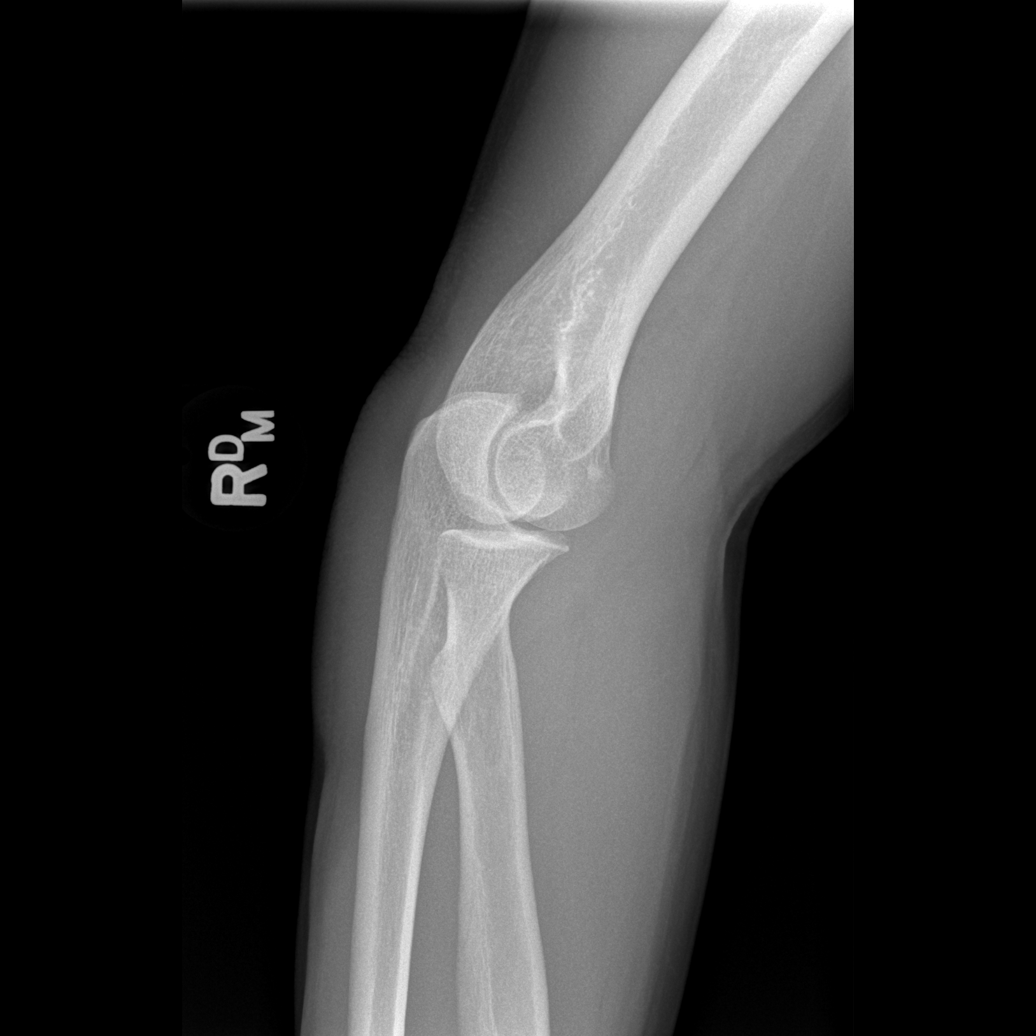

[x elbow joint lat right]
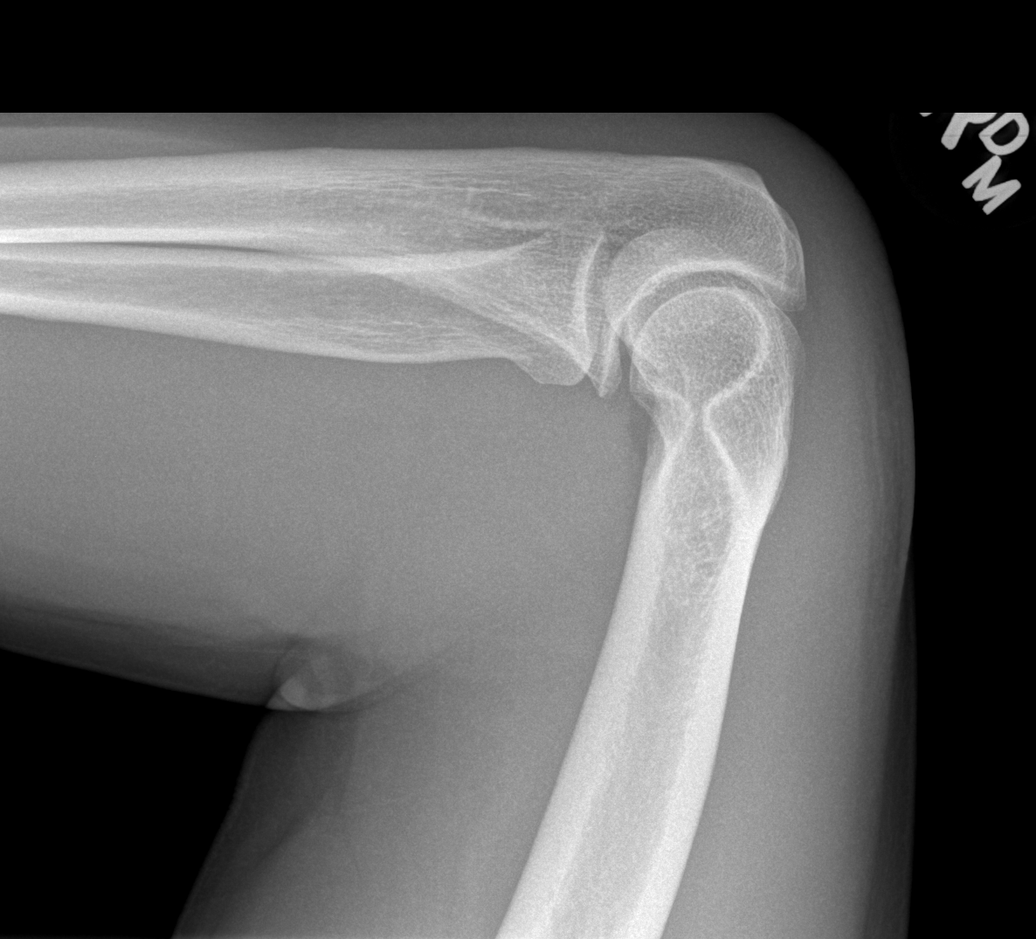

[4 of 4 positions shown; findings below may reference images not displayed]

FINDINGS: There is no evidence for acute fracture or dislocation.
No soft tissue foreign body or gas identified.  No evidence for
joint effusion. There is soft tissue swelling along the posterior
aspect of the elbow, adjacent to the olecranon.
IMPRESSION: 1.  Soft tissue swelling.
2. No evidence for acute osseous abnormality.

## 2019-10-06 ENCOUNTER — Encounter (HOSPITAL_BASED_OUTPATIENT_CLINIC_OR_DEPARTMENT_OTHER): Payer: Self-pay | Admitting: *Deleted

## 2019-10-06 ENCOUNTER — Other Ambulatory Visit: Payer: Self-pay

## 2019-10-06 ENCOUNTER — Emergency Department (HOSPITAL_BASED_OUTPATIENT_CLINIC_OR_DEPARTMENT_OTHER): Payer: 59

## 2019-10-06 ENCOUNTER — Emergency Department (HOSPITAL_BASED_OUTPATIENT_CLINIC_OR_DEPARTMENT_OTHER)
Admission: EM | Admit: 2019-10-06 | Discharge: 2019-10-06 | Disposition: A | Discharge status: Home / Self Care | Payer: 59 | Attending: Emergency Medicine | Admitting: Emergency Medicine

## 2019-10-06 DIAGNOSIS — R0789 Other chest pain: Secondary | ICD-10-CM

## 2019-10-06 DIAGNOSIS — R072 Precordial pain: Secondary | ICD-10-CM | POA: Diagnosis not present

## 2019-10-06 LAB — BASIC METABOLIC PANEL
Anion gap: 9 (ref 5–15)
BUN: 21 mg/dL (ref 8–23)
CO2: 27 mmol/L (ref 22–32)
Calcium: 8.8 mg/dL — ABNORMAL LOW (ref 8.9–10.3)
Chloride: 101 mmol/L (ref 98–111)
Creatinine, Ser: 1.24 mg/dL (ref 0.61–1.24)
GFR calc Af Amer: >60 mL/min (ref >60)
GFR calc non Af Amer: >60 mL/min (ref >60)
Glucose, Bld: 102 mg/dL — ABNORMAL HIGH (ref 70–99)
Potassium: 3.9 mmol/L (ref 3.5–5.1)
Sodium: 137 mmol/L (ref 135–145)

## 2019-10-06 LAB — TROPONIN I (HIGH SENSITIVITY)
Troponin I (High Sensitivity): 12 ng/L (ref <18)
Troponin I (High Sensitivity): 12 ng/L (ref <18)

## 2019-10-06 LAB — CBC
HCT: 41.2 % (ref 39.0–52.0)
Hemoglobin: 13.9 g/dL (ref 13.0–17.0)
MCH: 30.2 pg (ref 26.0–34.0)
MCHC: 33.7 g/dL (ref 30.0–36.0)
MCV: 89.4 fL (ref 80.0–100.0)
Platelets: 187 10*3/uL (ref 150–400)
RBC: 4.61 MIL/uL (ref 4.22–5.81)
RDW: 12 % (ref 11.5–15.5)
WBC: 5.9 10*3/uL (ref 4.0–10.5)
nRBC: 0 % (ref 0.0–0.2)

## 2019-10-06 LAB — D-DIMER, QUANTITATIVE: D-Dimer, Quant: 0.28 ug/mL-FEU (ref 0.00–0.50)

## 2019-10-06 NOTE — Discharge Instructions (Signed)
Try pepcid or tagamet up to twice a day.  Try to avoid things that may make this worse, most commonly these are spicy foods tomato based products fatty foods chocolate and peppermint.  Alcohol and tobacco can also make this worse.  Return to the emergency department for sudden worsening pain fever or inability to eat or drink.  

## 2019-10-06 NOTE — ED Provider Notes (Signed)
[facility] EMERGENCY DEPARTMENT Provider Note   CSN: [id number] Arrival date & time: 10/06/19  1952     History Chief Complaint  Patient presents with  . Chest Pain    Jorge Arnold is a 64 y.o. male.  64 yo M with a chief complaints of chest pain.  This occurred while he was watching a [employer].  Described as diffusely across the lower portion of his chest described as sharp.  Nothing seemed to make it better or worse.  Lasted for about an hour or so and then resolved on his way to x-ray.  He denies any thing that made his pain better or worse during the course.  Is never had pain like this before.  Denies cough congestion or fever.  Denies history of MI.  Has a history of hyperlipidemia is controlled with medications denies hypertension diabetes smoking or family history of MI.  Patient denies history of PE or DVT denies hemoptysis denies unilateral lower extremity edema.  He did have a recent procedure that required sedation due to a kidney stone.  The history is provided by the patient.  Chest Pain Pain location:  Substernal area Pain quality: aching and sharp   Pain radiates to:  Does not radiate Pain severity:  Severe Onset quality:  Gradual Duration:  1 hour Timing:  Constant Progression:  Resolved Chronicity:  New Relieved by:  Nothing Worsened by:  Nothing Ineffective treatments:  None tried Associated symptoms: no abdominal pain, no fever, no headache, no palpitations, no shortness of breath and no vomiting        Past Medical History:  Diagnosis Date  . Hypercholesteremia   . Prostate enlargement     Patient Active Problem List   Diagnosis Date Noted  . Cellulitis 06/21/2012  . Septic bursitis of elbow 06/17/2012  . Other and unspecified hyperlipidemia 06/17/2012  . Olecranon bursitis of right elbow 06/16/2012    History reviewed. No pertinent surgical history.     Family History  Problem Relation Age of Onset  . Hypertension  Mother   . Heart attack Father   . Diabetes Neg Hx   . Hyperlipidemia Neg Hx   . Sudden death Neg Hx     Social History   Tobacco Use  . Smoking status: Never Smoker  . Smokeless tobacco: Never Used  Substance Use Topics  . Alcohol use: No  . Drug use: No    Home Medications Prior to Admission medications   Medication Sig Start Date End Date Taking? Authorizing Provider  finasteride (PROSCAR) 5 MG tablet Take 1 tablet by mouth daily. 01/16/16  Yes [provider]  ketorolac (TORADOL) 10 MG tablet Take by mouth. 09/21/19  Yes [provider]  meloxicam (MOBIC) 7.5 MG tablet Take 1 tablet by mouth daily as needed. 02/28/19  Yes [provider]  Multiple Vitamin (MULTIVITAMIN) capsule Take 1 capsule by mouth daily.   Yes [provider]  Multiple Vitamin (THERA) TABS Take 1 tablet by mouth daily.   Yes [provider]  Omega-3 1000 MG CAPS Take by mouth.   Yes [provider]  oxybutynin (DITROPAN) 5 MG tablet Take by mouth. 09/27/19 10/07/19 Yes [provider]  simvastatin (ZOCOR) 5 MG tablet Take 5 mg by mouth at bedtime.   Yes [provider]  tamsulosin (FLOMAX) 0.4 MG CAPS capsule TAKE 1 CAPSULE(0.4 MG) BY MOUTH DAILY FOR 7 DAYS 10/03/19  Yes [provider]  dicloxacillin (DYNAPEN) 500 MG capsule Take  1 capsule (500 mg total) by mouth 4 (four) times daily. 06/21/12   Cliffton Asters, MD  HYDROcodone-acetaminophen (NORCO/VICODIN) 5-325 MG per tablet Take 1 tablet by mouth every 6 (six) hours as needed for pain. 06/15/12   Hudnall, Azucena Fallen, MD  phenazopyridine (PYRIDIUM) 200 MG tablet TAKE 1 TABLET BY MOUTH THREE TIMES DAILY FOR UP TO 7 DAYS FOR PAIN 09/27/19   [provider]  simvastatin (ZOCOR) 10 MG tablet Take 10 mg by mouth at bedtime. 08/12/19   [provider]    Allergies    Patient has no known allergies.  Review of Systems   Review of Systems  Constitutional: Negative for chills  and fever.  HENT: Negative for congestion and facial swelling.   Eyes: Negative for discharge and visual disturbance.  Respiratory: Negative for shortness of breath.   Cardiovascular: Positive for chest pain. Negative for palpitations.  Gastrointestinal: Negative for abdominal pain, diarrhea and vomiting.  Musculoskeletal: Negative for arthralgias and myalgias.  Skin: Negative for color change and rash.  Neurological: Negative for tremors, syncope and headaches.  Psychiatric/Behavioral: Negative for confusion and dysphoric mood.    Physical Exam Updated Vital Signs BP (!) 142/78 (BP Location: Left Arm)   Pulse (!) 56   Temp 97.8 F (36.6 C) (Oral)   Resp 19   Ht 5\' 8"  (1.727 m)   Wt 79.4 kg   SpO2 96%   BMI 26.61 kg/m   Physical Exam Vitals and nursing note reviewed.  Constitutional:      Appearance: He is well-developed.  HENT:     Head: Normocephalic and atraumatic.  Eyes:     Pupils: Pupils are equal, round, and reactive to light.  Neck:     Vascular: No JVD.  Cardiovascular:     Rate and Rhythm: Normal rate and regular rhythm.     Heart sounds: No murmur heard.  No friction rub. No gallop.   Pulmonary:     Effort: No respiratory distress.     Breath sounds: No wheezing.  Abdominal:     General: There is no distension.     Tenderness: There is no guarding or rebound.  Musculoskeletal:        General: Normal range of motion.     Cervical back: Normal range of motion and neck supple.  Skin:    Coloration: Skin is not pale.     Findings: No rash.  Neurological:     Mental Status: He is alert and oriented to person, place, and time.  Psychiatric:        Behavior: Behavior normal.     ED Results / Procedures / Treatments   Labs (all labs ordered are listed, but only abnormal results are displayed) Labs Reviewed  BASIC METABOLIC PANEL - Abnormal; Notable for the following components:      Result Value   Glucose, Bld 102 (*)    Calcium 8.8 (*)    All  other components within normal limits  CBC  D-DIMER, QUANTITATIVE (NOT AT [facility])  TROPONIN I (HIGH SENSITIVITY)  TROPONIN I (HIGH SENSITIVITY)    EKG EKG Interpretation  Date/Time:  Thursday October 06 2019 19:56:25 EDT Ventricular Rate:  62 PR Interval:  150 QRS Duration: 86 QT Interval:  482 QTC Calculation: 489 R Axis:   55 Text Interpretation: Sinus rhythm with Premature atrial complexes with Abberant conduction Prolonged QT Abnormal ECG No old tracing to compare Confirmed by [id number]) on 10/06/2019 8:29:59 PM   Radiology DG Chest  2 View  Result Date: 10/06/2019 CLINICAL DATA:  Chest pain. EXAM: CHEST - 2 VIEW COMPARISON:  August 03, 2014 FINDINGS: Mild, diffuse, chronic appearing increased lung markings are noted. Mild areas of bibasilar atelectasis and/or infiltrate are noted. There is no evidence of a pleural effusion or pneumothorax. The cardiac silhouette is mildly enlarged. The visualized skeletal structures are unremarkable. IMPRESSION: 1. Mild bibasilar atelectasis and/or infiltrate. 2. Mild cardiomegaly. Electronically Signed   By: Aram Candela M.D.   On: 10/06/2019 20:27    Procedures Procedures (including critical care time)  Medications Ordered in ED Medications - No data to display  ED Course  I have reviewed the triage vital signs and the nursing notes.  Pertinent labs & imaging results that were available during my care of the patient were reviewed by me and considered in my medical decision making (see chart for details).    MDM Rules/Calculators/A&P                          64 yo M with a chief complaint of chest pain.  This is atypical in nature lasted for about an hour and then resolved spontaneously.  EKG with some bradycardia and some reported frequent PVCs.  This is resolved on repeat assessment.  Now with a sinus bradycardia.  Will obtain laboratory evaluation.  With the recent procedure will obtain a D-dimer.  No significant electrolyte  abnormality no anemia.  Chest x-ray reviewed by me without focal infiltrate, or pneumothorax.  Delta troponin is negative.  D-dimer negative.  Will discharge patient home.  Treat as possible reflux.  PCP follow-up.  11:25 PM:  I have discussed the diagnosis/risks/treatment options with the patient and believe the pt to be eligible for discharge home to follow-up with PCP. We also discussed returning to the ED immediately if new or worsening sx occur. We discussed the sx which are most concerning (e.g., sudden worsening pain, fever, inability to tolerate by mouth) that necessitate immediate return. Medications administered to the patient during their visit and any new prescriptions provided to the patient are listed below.  Medications given during this visit Medications - No data to display   The patient appears reasonably screen and/or stabilized for discharge and I doubt any other medical condition or other [facility] requiring further screening, evaluation, or treatment in the ED at this time prior to discharge.      Final Clinical Impression(s) / ED Diagnoses Final diagnoses:  Atypical chest pain    Rx / DC Orders ED Discharge Orders    None       Melene Plan, DO 10/06/19 2325

## 2019-10-06 NOTE — ED Triage Notes (Signed)
Chest pain x 20 minutes sharp in nature. Tightness comes and goes.

## 2022-06-15 IMAGING — DX DG CHEST 2V
2 series · 2 of 2 positions shown · non-contrast
Comparison: August 03, 2014

CLINICAL DATA: Chest pain.

EXAM:
CHEST - 2 VIEW

[chest pa]
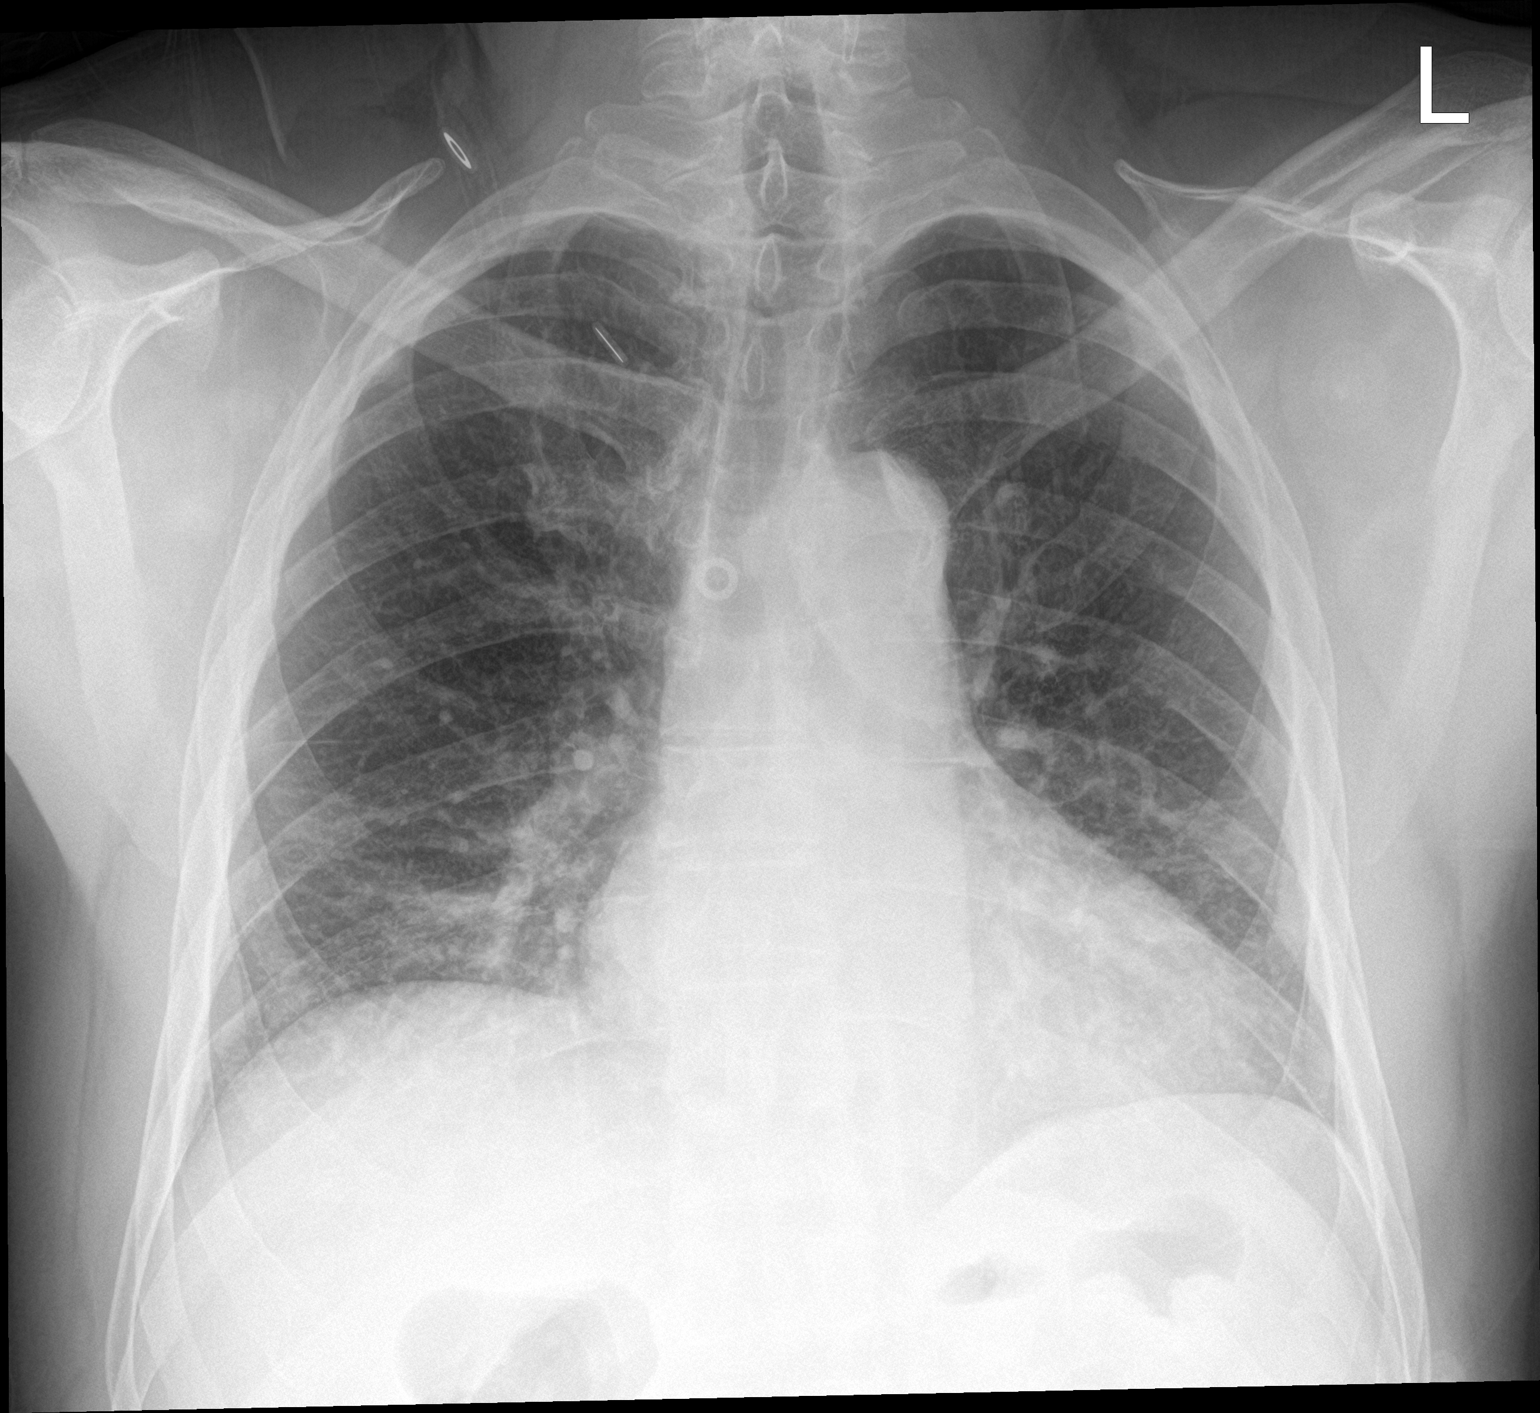

[chest lat]
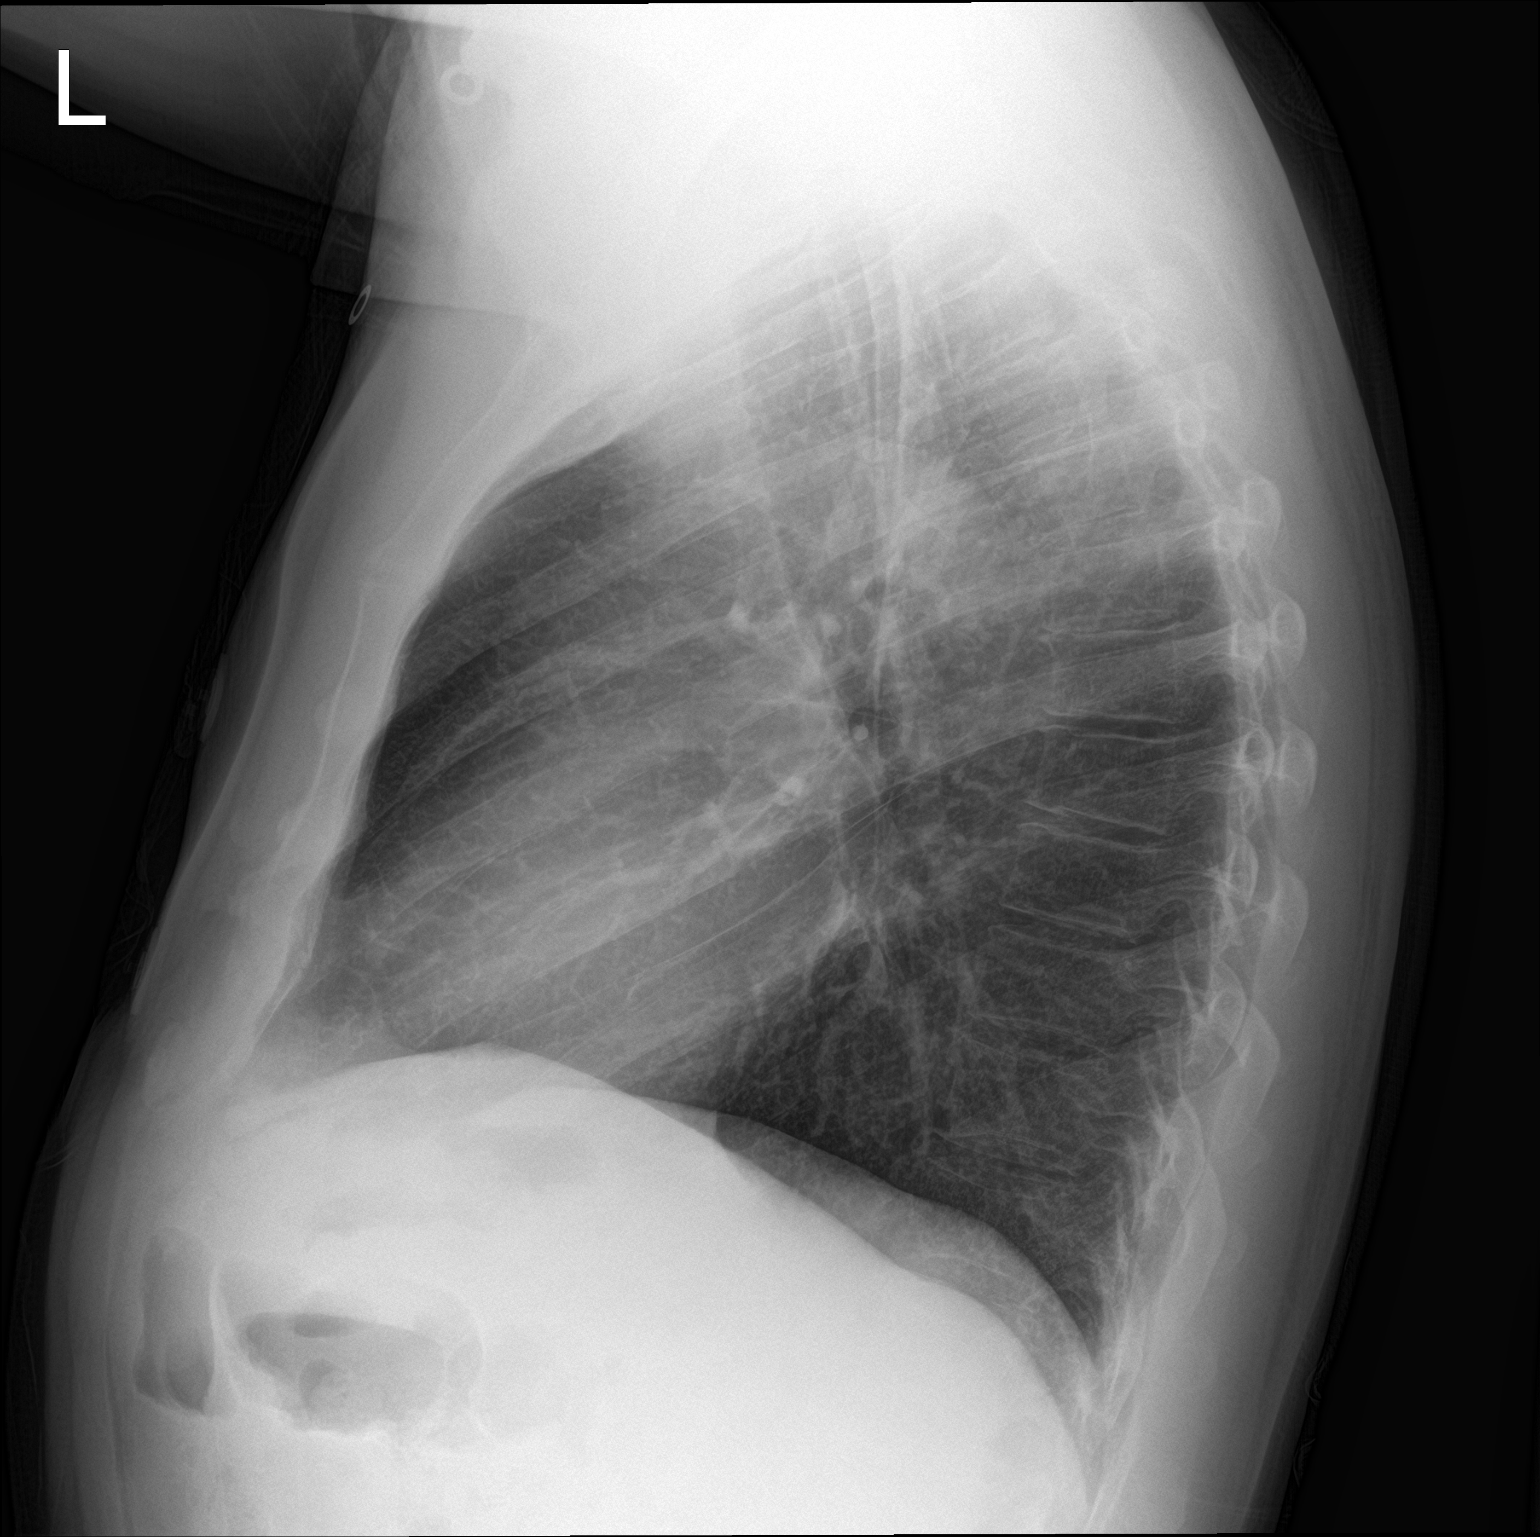

[2 of 2 positions shown; findings below may reference images not displayed]

FINDINGS: Mild, diffuse, chronic appearing increased lung markings are noted.
Mild areas of bibasilar atelectasis and/or infiltrate are noted.
There is no evidence of a pleural effusion or pneumothorax. The
cardiac silhouette is mildly enlarged. The visualized skeletal
structures are unremarkable.
IMPRESSION: 1. Mild bibasilar atelectasis and/or infiltrate.
2. Mild cardiomegaly.

## 2023-07-01 ENCOUNTER — Other Ambulatory Visit (HOSPITAL_COMMUNITY): Payer: Self-pay

## 2023-07-08 ENCOUNTER — Other Ambulatory Visit: Payer: Self-pay

## 2023-07-09 ENCOUNTER — Other Ambulatory Visit (HOSPITAL_COMMUNITY): Payer: Self-pay

## 2023-07-13 ENCOUNTER — Other Ambulatory Visit: Payer: Self-pay

## 2023-07-13 ENCOUNTER — Other Ambulatory Visit (HOSPITAL_COMMUNITY): Payer: Self-pay

## 2023-07-14 ENCOUNTER — Other Ambulatory Visit (HOSPITAL_COMMUNITY): Payer: Self-pay

## 2023-07-14 ENCOUNTER — Other Ambulatory Visit: Payer: Self-pay

## 2023-07-14 MED ORDER — SILDENAFIL CITRATE 50 MG PO TABS
ORAL_TABLET | ORAL | 2 refills | Status: AC
  Filled 2023-07-14 – 2023-09-10 (×2): qty 10, 30d supply, fill #0

## 2023-07-14 MED ORDER — FINASTERIDE 5 MG PO TABS
ORAL_TABLET | ORAL | 1 refills | Status: AC
  Filled 2023-07-14: qty 90, 90d supply, fill #0
  Filled 2023-07-14: qty 30, 30d supply, fill #0

## 2023-07-14 MED ORDER — TRAZODONE HCL 50 MG PO TABS
50.0000 mg | ORAL_TABLET | Freq: Every day | ORAL | 2 refills | Status: AC
  Filled 2023-07-14: qty 60, 60d supply, fill #0
  Filled 2023-07-14: qty 30, 30d supply, fill #0

## 2023-07-14 MED ORDER — MELOXICAM 7.5 MG PO TABS
7.5000 mg | ORAL_TABLET | Freq: Every day | ORAL | 1 refills | Status: AC | PRN
  Filled 2023-07-14: qty 90, 90d supply, fill #0
  Filled 2023-07-14: qty 30, 30d supply, fill #0

## 2023-07-14 MED ORDER — SIMVASTATIN 10 MG PO TABS
10.0000 mg | ORAL_TABLET | Freq: Every evening | ORAL | 1 refills | Status: AC
  Filled 2023-07-14 (×2): qty 90, 90d supply, fill #0

## 2023-07-14 MED ORDER — AMLODIPINE BESYLATE 5 MG PO TABS
5.0000 mg | ORAL_TABLET | Freq: Every day | ORAL | 3 refills | Status: AC
  Filled 2023-07-14 – 2023-08-27 (×2): qty 90, 90d supply, fill #0
  Filled 2023-09-10 – 2023-11-06 (×2): qty 90, 90d supply, fill #1

## 2023-07-15 ENCOUNTER — Other Ambulatory Visit (HOSPITAL_COMMUNITY): Payer: Self-pay

## 2023-08-27 ENCOUNTER — Other Ambulatory Visit (HOSPITAL_COMMUNITY): Payer: Self-pay

## 2023-09-10 ENCOUNTER — Other Ambulatory Visit (HOSPITAL_COMMUNITY): Payer: Self-pay

## 2023-09-11 ENCOUNTER — Other Ambulatory Visit: Payer: Self-pay

## 2023-09-11 ENCOUNTER — Other Ambulatory Visit (HOSPITAL_COMMUNITY): Payer: Self-pay

## 2023-09-15 ENCOUNTER — Other Ambulatory Visit (HOSPITAL_COMMUNITY): Payer: Self-pay

## 2023-10-06 ENCOUNTER — Other Ambulatory Visit (HOSPITAL_COMMUNITY): Payer: Self-pay

## 2023-10-09 ENCOUNTER — Other Ambulatory Visit (HOSPITAL_COMMUNITY): Payer: Self-pay

## 2023-11-03 ENCOUNTER — Other Ambulatory Visit: Payer: Self-pay

## 2023-11-03 ENCOUNTER — Encounter: Payer: Self-pay | Admitting: Pharmacist

## 2023-11-03 ENCOUNTER — Other Ambulatory Visit (HOSPITAL_COMMUNITY): Payer: Self-pay

## 2023-11-03 MED ORDER — MELOXICAM 7.5 MG PO TABS
7.5000 mg | ORAL_TABLET | Freq: Every day | ORAL | 1 refills | Status: AC | PRN
  Filled 2023-11-03 – 2023-11-06 (×2): qty 30, 30d supply, fill #0

## 2023-11-03 MED ORDER — SIMVASTATIN 10 MG PO TABS
10.0000 mg | ORAL_TABLET | Freq: Every evening | ORAL | 1 refills | Status: DC
  Filled 2023-11-03 – 2023-11-06 (×2): qty 90, 90d supply, fill #0

## 2023-11-03 MED ORDER — FINASTERIDE 5 MG PO TABS
5.0000 mg | ORAL_TABLET | Freq: Every day | ORAL | 1 refills | Status: DC
  Filled 2023-11-03 – 2023-11-06 (×2): qty 90, 90d supply, fill #0

## 2023-11-03 MED ORDER — SILDENAFIL CITRATE 50 MG PO TABS
50.0000 mg | ORAL_TABLET | Freq: Every day | ORAL | 2 refills | Status: AC | PRN
  Filled 2023-11-03 – 2023-11-06 (×2): qty 10, 10d supply, fill #0

## 2023-11-06 ENCOUNTER — Encounter: Payer: Self-pay | Admitting: Pharmacist

## 2023-11-06 ENCOUNTER — Other Ambulatory Visit (HOSPITAL_COMMUNITY): Payer: Self-pay

## 2023-11-06 ENCOUNTER — Other Ambulatory Visit: Payer: Self-pay

## 2023-11-11 ENCOUNTER — Other Ambulatory Visit (HOSPITAL_COMMUNITY): Payer: Self-pay

## 2023-11-11 ENCOUNTER — Other Ambulatory Visit: Payer: Self-pay

## 2023-11-11 ENCOUNTER — Other Ambulatory Visit (HOSPITAL_BASED_OUTPATIENT_CLINIC_OR_DEPARTMENT_OTHER): Payer: Self-pay
